# Patient Record
Sex: Male | Born: 1950 | Race: Black or African American | Hispanic: No | Marital: Single | Smoking: Never smoker
Health system: Southern US, Community
[De-identification: ages and names within clinical notes are randomized; demographics above are authoritative.]

## PROBLEM LIST (undated history)

## (undated) DIAGNOSIS — F209 Schizophrenia, unspecified: Secondary | ICD-10-CM

## (undated) DIAGNOSIS — E119 Type 2 diabetes mellitus without complications: Secondary | ICD-10-CM

## (undated) HISTORY — PX: NOSE SURGERY: SHX723

---

## 2018-01-29 ENCOUNTER — Encounter (HOSPITAL_COMMUNITY): Payer: Self-pay | Admitting: Emergency Medicine

## 2018-01-29 ENCOUNTER — Emergency Department (HOSPITAL_COMMUNITY)
Admission: EM | Admit: 2018-01-29 | Discharge: 2018-01-29 | Disposition: A | Discharge status: Home / Self Care | Payer: Medicare (Managed Care) | Attending: Emergency Medicine | Admitting: Emergency Medicine

## 2018-01-29 ENCOUNTER — Other Ambulatory Visit: Payer: Self-pay

## 2018-01-29 ENCOUNTER — Emergency Department (HOSPITAL_COMMUNITY): Payer: Medicare (Managed Care)

## 2018-01-29 DIAGNOSIS — E1165 Type 2 diabetes mellitus with hyperglycemia: Secondary | ICD-10-CM | POA: Diagnosis not present

## 2018-01-29 DIAGNOSIS — R739 Hyperglycemia, unspecified: Secondary | ICD-10-CM

## 2018-01-29 DIAGNOSIS — R5383 Other fatigue: Secondary | ICD-10-CM | POA: Insufficient documentation

## 2018-01-29 HISTORY — DX: Schizophrenia, unspecified: F20.9

## 2018-01-29 HISTORY — DX: Type 2 diabetes mellitus without complications: E11.9

## 2018-01-29 LAB — BASIC METABOLIC PANEL
ANION GAP: 12 (ref 5–15)
ANION GAP: 11 (ref 5–15)
BUN: 19 mg/dL (ref 6–20)
BUN: 15 mg/dL (ref 6–20)
CALCIUM: 8.9 mg/dL (ref 8.9–10.3)
CHLORIDE: 107 mmol/L (ref 101–111)
CO2: 24 mmol/L (ref 22–32)
CO2: 26 mmol/L (ref 22–32)
Calcium: 9 mg/dL (ref 8.9–10.3)
Chloride: 93 mmol/L — ABNORMAL LOW (ref 101–111)
Creatinine, Ser: 1.12 mg/dL (ref 0.61–1.24)
Creatinine, Ser: 0.93 mg/dL (ref 0.61–1.24)
GFR calc non Af Amer: >60 mL/min (ref >60)
GLUCOSE: 794 mg/dL — AB (ref 65–99)
Glucose, Bld: 170 mg/dL — ABNORMAL HIGH (ref 65–99)
POTASSIUM: 5.1 mmol/L (ref 3.5–5.1)
POTASSIUM: 4.6 mmol/L (ref 3.5–5.1)
SODIUM: 129 mmol/L — AB (ref 135–145)
Sodium: 144 mmol/L (ref 135–145)

## 2018-01-29 LAB — URINALYSIS, ROUTINE W REFLEX MICROSCOPIC
Bacteria, UA: NONE SEEN
Bilirubin Urine: NEGATIVE
Glucose, UA: >=500 mg/dL — AB
Hgb urine dipstick: NEGATIVE
Ketones, ur: NEGATIVE mg/dL
Nitrite: NEGATIVE
PH: 5 (ref 5.0–8.0)
PROTEIN: NEGATIVE mg/dL
Specific Gravity, Urine: 1.023 (ref 1.005–1.030)

## 2018-01-29 LAB — BLOOD GAS, VENOUS
Acid-Base Excess: 0.9 mmol/L (ref 0.0–2.0)
BICARBONATE: 26.3 mmol/L (ref 20.0–28.0)
O2 Saturation: 48.1 %
PCO2 VEN: 47.8 mmHg (ref 44.0–60.0)
PH VEN: 7.36 (ref 7.250–7.430)
Patient temperature: 98.6

## 2018-01-29 LAB — CBG MONITORING, ED
GLUCOSE-CAPILLARY: 402 mg/dL — AB (ref 65–99)
GLUCOSE-CAPILLARY: 96 mg/dL (ref 65–99)
Glucose-Capillary: >600 mg/dL (ref 65–99)

## 2018-01-29 LAB — HEPATIC FUNCTION PANEL
ALBUMIN: 3 g/dL — AB (ref 3.5–5.0)
ALT: 13 U/L — ABNORMAL LOW (ref 17–63)
AST: 16 U/L (ref 15–41)
Alkaline Phosphatase: 135 U/L — ABNORMAL HIGH (ref 38–126)
BILIRUBIN TOTAL: 0.3 mg/dL (ref 0.3–1.2)
Bilirubin, Direct: <0.1 mg/dL — ABNORMAL LOW (ref 0.1–0.5)
TOTAL PROTEIN: 7.3 g/dL (ref 6.5–8.1)

## 2018-01-29 LAB — CBC
HEMATOCRIT: 39.8 % (ref 39.0–52.0)
HEMOGLOBIN: 12.7 g/dL — AB (ref 13.0–17.0)
MCH: 28.7 pg (ref 26.0–34.0)
MCHC: 31.9 g/dL (ref 30.0–36.0)
MCV: 90 fL (ref 78.0–100.0)
Platelets: 193 10*3/uL (ref 150–400)
RBC: 4.42 MIL/uL (ref 4.22–5.81)
RDW: 15.2 % (ref 11.5–15.5)
WBC: 8.6 10*3/uL (ref 4.0–10.5)

## 2018-01-29 LAB — LIPASE, BLOOD: Lipase: 23 U/L (ref 11–51)

## 2018-01-29 MED ORDER — SODIUM CHLORIDE 0.9 % IV BOLUS
1000.0000 mL | Freq: Once | INTRAVENOUS | Status: AC
  Administered 2018-01-29: 1000 mL via INTRAVENOUS

## 2018-01-29 MED ORDER — INSULIN ASPART 100 UNIT/ML ~~LOC~~ SOLN
10.0000 [IU] | Freq: Once | SUBCUTANEOUS | Status: AC
  Administered 2018-01-29: 10 [IU] via SUBCUTANEOUS
  Filled 2018-01-29: qty 1

## 2018-01-29 MED ORDER — LACTATED RINGERS IV BOLUS
1000.0000 mL | Freq: Once | INTRAVENOUS | Status: AC
  Administered 2018-01-29: 1000 mL via INTRAVENOUS

## 2018-01-29 MED ORDER — INSULIN ASPART 100 UNIT/ML ~~LOC~~ SOLN
5.0000 [IU] | Freq: Once | SUBCUTANEOUS | Status: AC
  Administered 2018-01-29: 5 [IU] via SUBCUTANEOUS
  Filled 2018-01-29: qty 1

## 2018-01-29 MED ORDER — ONDANSETRON HCL 4 MG/2ML IJ SOLN
4.0000 mg | Freq: Once | INTRAMUSCULAR | Status: AC
  Administered 2018-01-29: 4 mg via INTRAVENOUS
  Filled 2018-01-29: qty 2

## 2018-01-29 NOTE — ED Notes (Signed)
Pt is alert and oriented x 4 and is verbally responsive. Pt dtr and granddaughter are present in the room at this time. Pt requesting food at this time.

## 2018-01-29 NOTE — ED Provider Notes (Signed)
[facility]-EMERGENCY DEPT Provider Note   CSN: [id number] Arrival date & time: 01/29/18  1315     History   Chief Complaint No chief complaint on file. hyperglycemia   HPI Vernon Martinez is a 67 y.o. male.  The history is provided by the patient and medical records.  Hyperglycemia  Blood sugar level PTA:  >600 Severity:  Severe Onset quality:  Gradual Timing:  Constant Progression:  Unchanged Chronicity:  Recurrent Diabetes status:  Controlled with insulin Context: recent change in diet (drinking ginger ale)   Relieved by:  Nothing Ineffective treatments:  None tried Associated symptoms: abdominal pain, dehydration, fatigue, malaise, nausea, polyuria and vomiting   Associated symptoms: no altered mental status, no chest pain, no dizziness, no dysuria, no fever and no shortness of breath     Past Medical History:  Diagnosis Date  . Diabetes mellitus without complication (HCC)     There are no active problems to display for this patient.   Past Surgical History:  Procedure Laterality Date  . NOSE SURGERY          Home Medications    Prior to Admission medications   Not on File    Family History No family history on file.  Social History Social History   Tobacco Use  . Smoking status: Never Smoker  . Smokeless tobacco: Never Used  Substance Use Topics  . Alcohol use: Not on file  . Drug use: Not on file     Allergies   Patient has no known allergies.   Review of Systems Review of Systems  Constitutional: Positive for chills and fatigue. Negative for fever.  HENT: Negative for congestion.   Eyes: Negative for visual disturbance.  Respiratory: Positive for cough. Negative for chest tightness, shortness of breath and wheezing.   Cardiovascular: Negative for chest pain.  Gastrointestinal: Positive for abdominal pain, nausea and vomiting. Negative for constipation and diarrhea.  Endocrine: Positive for polyuria.    Genitourinary: Negative for dysuria and flank pain.  Musculoskeletal: Negative for back pain, neck pain and neck stiffness.  Skin: Negative for rash and wound.  Neurological: Negative for dizziness, light-headedness and headaches.  Psychiatric/Behavioral: Negative for agitation.     Physical Exam Updated Vital Signs BP 124/80 (BP Location: Left Arm)   Pulse 87   Temp 98.4 F (36.9 C) (Oral)   Resp 18   Ht  (1.702 m)   Wt 74.8 kg (165 lb)   SpO2 97%   BMI 25.84 kg/m   Physical Exam  Constitutional: He is oriented to person, place, and time. He appears well-developed and well-nourished. No distress.  HENT:  Head: Normocephalic.  Nose: Nose normal.  Mouth/Throat: Oropharynx is clear and moist. No oropharyngeal exudate.  Eyes: Pupils are equal, round, and reactive to light. Conjunctivae and EOM are normal.  Neck: Normal range of motion.  Cardiovascular: Normal rate and intact distal pulses.  No murmur heard. Pulmonary/Chest: Effort normal. No respiratory distress. He has no wheezes. He has no rales. He exhibits no tenderness.  Musculoskeletal: He exhibits no edema.  Lymphadenopathy:    He has no cervical adenopathy.  Neurological: He is alert and oriented to person, place, and time. No cranial nerve deficit or sensory deficit. He exhibits normal muscle tone.  Skin: Capillary refill takes less than 2 seconds. He is not diaphoretic. No erythema. No pallor.  Psychiatric: He has a normal mood and affect.  Nursing note and vitals reviewed.    ED Treatments /  Results  Labs (all labs ordered are listed, but only abnormal results are displayed) Labs Reviewed  BASIC METABOLIC PANEL - Abnormal; Notable for the following components:      Result Value   Sodium 129 (*)    Chloride 93 (*)    Glucose, Bld 794 (*)    All other components within normal limits  CBC - Abnormal; Notable for the following components:   Hemoglobin 12.7 (*)    All other components within normal  limits  URINALYSIS, ROUTINE W REFLEX MICROSCOPIC - Abnormal; Notable for the following components:   Color, Urine STRAW (*)    Glucose, UA >=500 (*)    Leukocytes, UA TRACE (*)    All other components within normal limits  HEPATIC FUNCTION PANEL - Abnormal; Notable for the following components:   Albumin 3.0 (*)    ALT 13 (*)    Alkaline Phosphatase 135 (*)    Bilirubin, Direct <0.1 (*)    All other components within normal limits  CBG MONITORING, ED - Abnormal; Notable for the following components:   Glucose-Capillary >600 (*)    All other components within normal limits  URINE CULTURE  LIPASE, BLOOD  BLOOD GAS, VENOUS  BASIC METABOLIC PANEL    EKG None  Radiology Dg Chest 2 View  Result Date: 01/29/2018 CLINICAL DATA:  Hyperglycemia and cough EXAM: CHEST - 2 VIEW COMPARISON:  None. FINDINGS: The heart size and mediastinal contours are within normal limits. Both lungs are clear. The visualized skeletal structures are unremarkable. IMPRESSION: No active cardiopulmonary disease. Electronically Signed   By: Alcide Clever M.D.   On: 01/29/2018 15:11    Procedures Procedures (including critical care time)  CRITICAL CARE Performed by: Canary Brim Tegeler Total critical care time: 35 minutes Critical care time was exclusive of separately billable procedures and treating other patients. Glucose >600 with iv insulin and fluids administered.  Critical care was necessary to treat or prevent imminent or life-threatening deterioration. Critical care was time spent personally by me on the following activities: development of treatment plan with patient and/or surrogate as well as nursing, discussions with consultants, evaluation of patient's response to treatment, examination of patient, obtaining history from patient or surrogate, ordering and performing treatments and interventions, ordering and review of laboratory studies, ordering and review of radiographic studies, pulse oximetry and  re-evaluation of patient's condition.   Medications Ordered in ED Medications  sodium chloride 0.9 % bolus 1,000 mL (1,000 mLs Intravenous New Bag/Given 01/29/18 1436)  ondansetron (ZOFRAN) injection 4 mg (4 mg Intravenous Given 01/29/18 1437)  insulin aspart (novoLOG) injection 10 Units (10 Units Subcutaneous Given 01/29/18 1531)  sodium chloride 0.9 % bolus 1,000 mL (1,000 mLs Intravenous New Bag/Given 01/29/18 1530)     Initial Impression / Assessment and Plan / ED Course  I have reviewed the triage vital signs and the nursing notes.  Pertinent labs & imaging results that were available during my care of the patient were reviewed by me and considered in my medical decision making (see chart for details).     Vernon Martinez is a 67 y.o. male with a past medical history significant for diabetes and schizophrenia who presents for nausea, vomiting, cough, hemoptysis, fatigue, urinary frequency, and hyperglycemia.  Patient is coming by family who reports that patient has been down in [place] for the last week and has been drinking lots of normal sugar ginger ale.  He reports that he gets very agitated and angry when he does not  get his ginger ale.  They are concerned that this may have sent him into elevated sugars.  Patient says that he has had a mild cough that has had some phlegm production as well as very faint hemoptysis.  He denies any chest pain but reports some left-sided abdominal pain.  He reports it is mild to moderate.  He does report some urinary frequency but denies dysuria.  He denies any abdominal trauma.  He reports the pain is cramping with nausea and vomiting.  He denies significant back pain.  He reports some mild fatigue but reports feeling dehydrated.   On exam, patient is alert.  Patient's lungs were clear.  Chest was nontender.  Abdomen was not tender on my exam.  Legs are nonedematous.  Patient's mucous membranes appear dry.  Clinically I am concerned for DKA  has  the patient's initial point-of-care glucose was greater than 600.  We will also look for infection may have contributed to his hyperglycemia given the cough, hemoptysis, and urinary symptoms.  Patient be given initial fluids and nausea medicine.  Patient will work-up to look for DKA.  Anticipate reassessment after work-up.  Patient's laboratory testing and work-up began to return.  No evidence of DKA as he is not acidotic.  Anion gap is normal.  Patient was found to have hyperglycemia of 794.  No evidence of UTI or pneumonia.  No leukocytosis.  Mild anemia.  Next  Patient will be given a second liter normal saline and given 10 units of insulin.  Next  Patient will have his glucose reassessed after interventions.  Given patient's lack of DKA, if glucose is improving, anticipate discharge home to continue his outpatient glucose management regimen and will be educated to avoid the sugary soft drinks which he reports he is drinking constantly.  Care transferred to Dr. Clarene Duke while awaiting repeat metabolic panel after fluids and insulin.   Final Clinical Impressions(s) / ED Diagnoses   Final diagnoses:  Hyperglycemia    Clinical Impression: 1. Hyperglycemia     Disposition: Care transferred to Dr. Clarene Duke while awaiting reassessment after fluids and insulin.  This note was prepared with assistance of Conservation officer, historic buildings. Occasional wrong-word or sound-a-like substitutions may have occurred due to the inherent limitations of voice recognition software.      Tegeler, Canary Brim, MD 01/29/18 2002

## 2018-01-29 NOTE — ED Notes (Signed)
P 

## 2018-01-29 NOTE — ED Notes (Signed)
When taking pt back to treatment room, grand daught was telling me how patient gets aggressive and upset with family when they try to tell him he can't drink sodas or eat lots of sweets.  Pt very addiment about not staying the night in hospital.  Pt refusing to get into gown when got to room

## 2018-01-29 NOTE — ED Notes (Signed)
Date and time results received: 01/29/18 1504 (use smartphrase ".now" to insert current time)  Test: Glucose Critical Value: 794  Name of Provider Notified: Dr. Rush Landmark  Orders Received? Or Actions Taken?:

## 2018-01-29 NOTE — ED Provider Notes (Signed)
I received this patient in signout from Dr. Rush Landmark.  He had presented with hyperglycemia in the setting of noncompliance with diabetic diet.  He was receiving insulin, fluids and we were awaiting improvement in blood glucose.  Repeat blood glucose after 2 L of fluids is 400.  Gave third liter of fluids as well as repeat dose of NovoLog.  Repeat BMP improved w/ glucose 170. Instructed pt to eat dinner with high protein, low carbs.  I emphasized the importance of strict compliance with diabetic diet and continuing all medications especially his insulin at home.  I have extensively reviewed return precautions with him and he has voiced understanding.  Patient discharged in satisfactory condition.   Little, Ambrose [place], MD 01/29/18 2024

## 2018-01-29 NOTE — ED Triage Notes (Signed)
Pt from [place] here visiting family, reports his CBG 500 and been urinating a lot. Called PCP instructed pt to go to nearest ED.

## 2018-01-30 LAB — URINE CULTURE

## 2019-05-10 IMAGING — CR DG CHEST 2V
2 series · 2 of 2 positions shown · non-contrast
Comparison: None.

CLINICAL DATA: Hyperglycemia and cough

EXAM:
CHEST - 2 VIEW

[w chest lat]
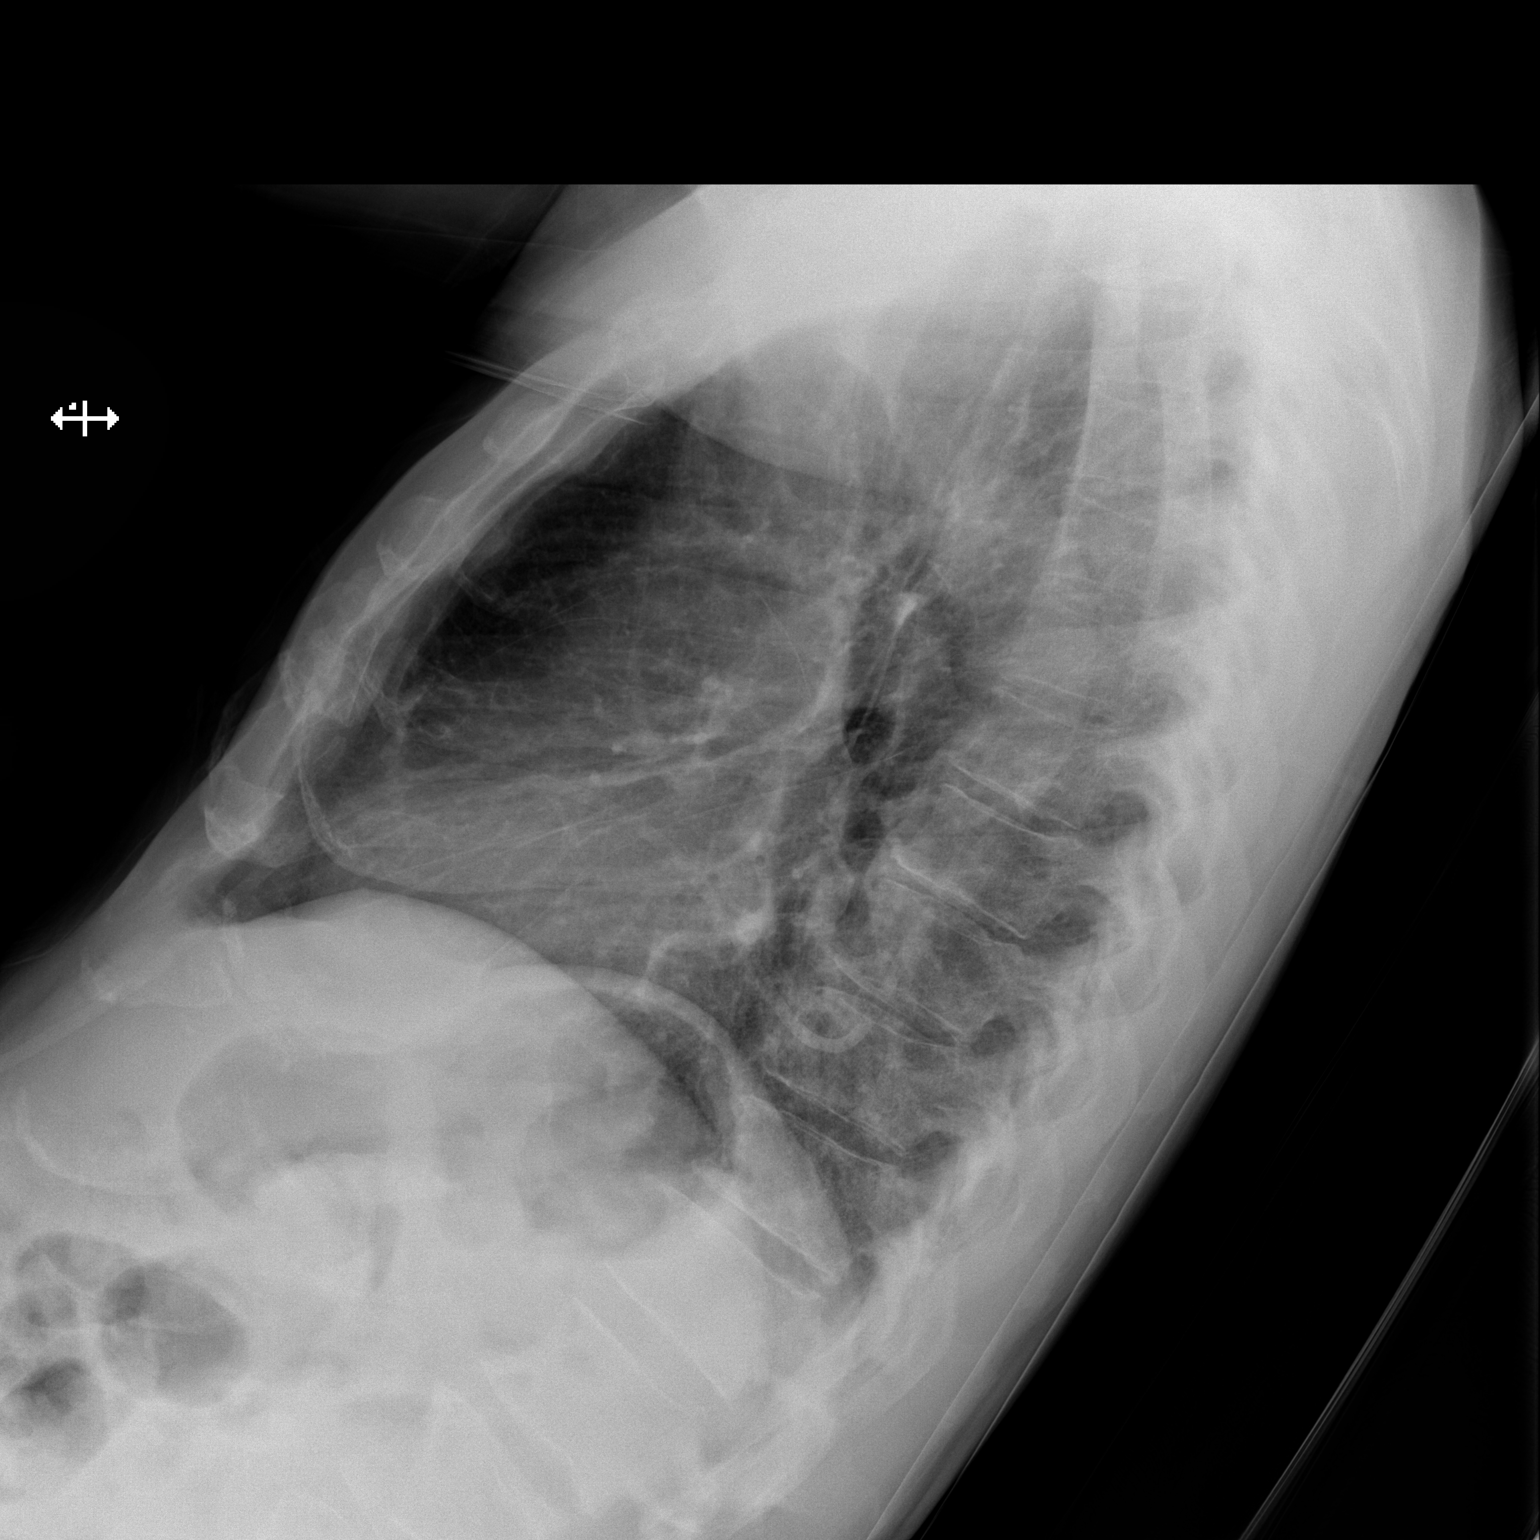

[x chest ap]
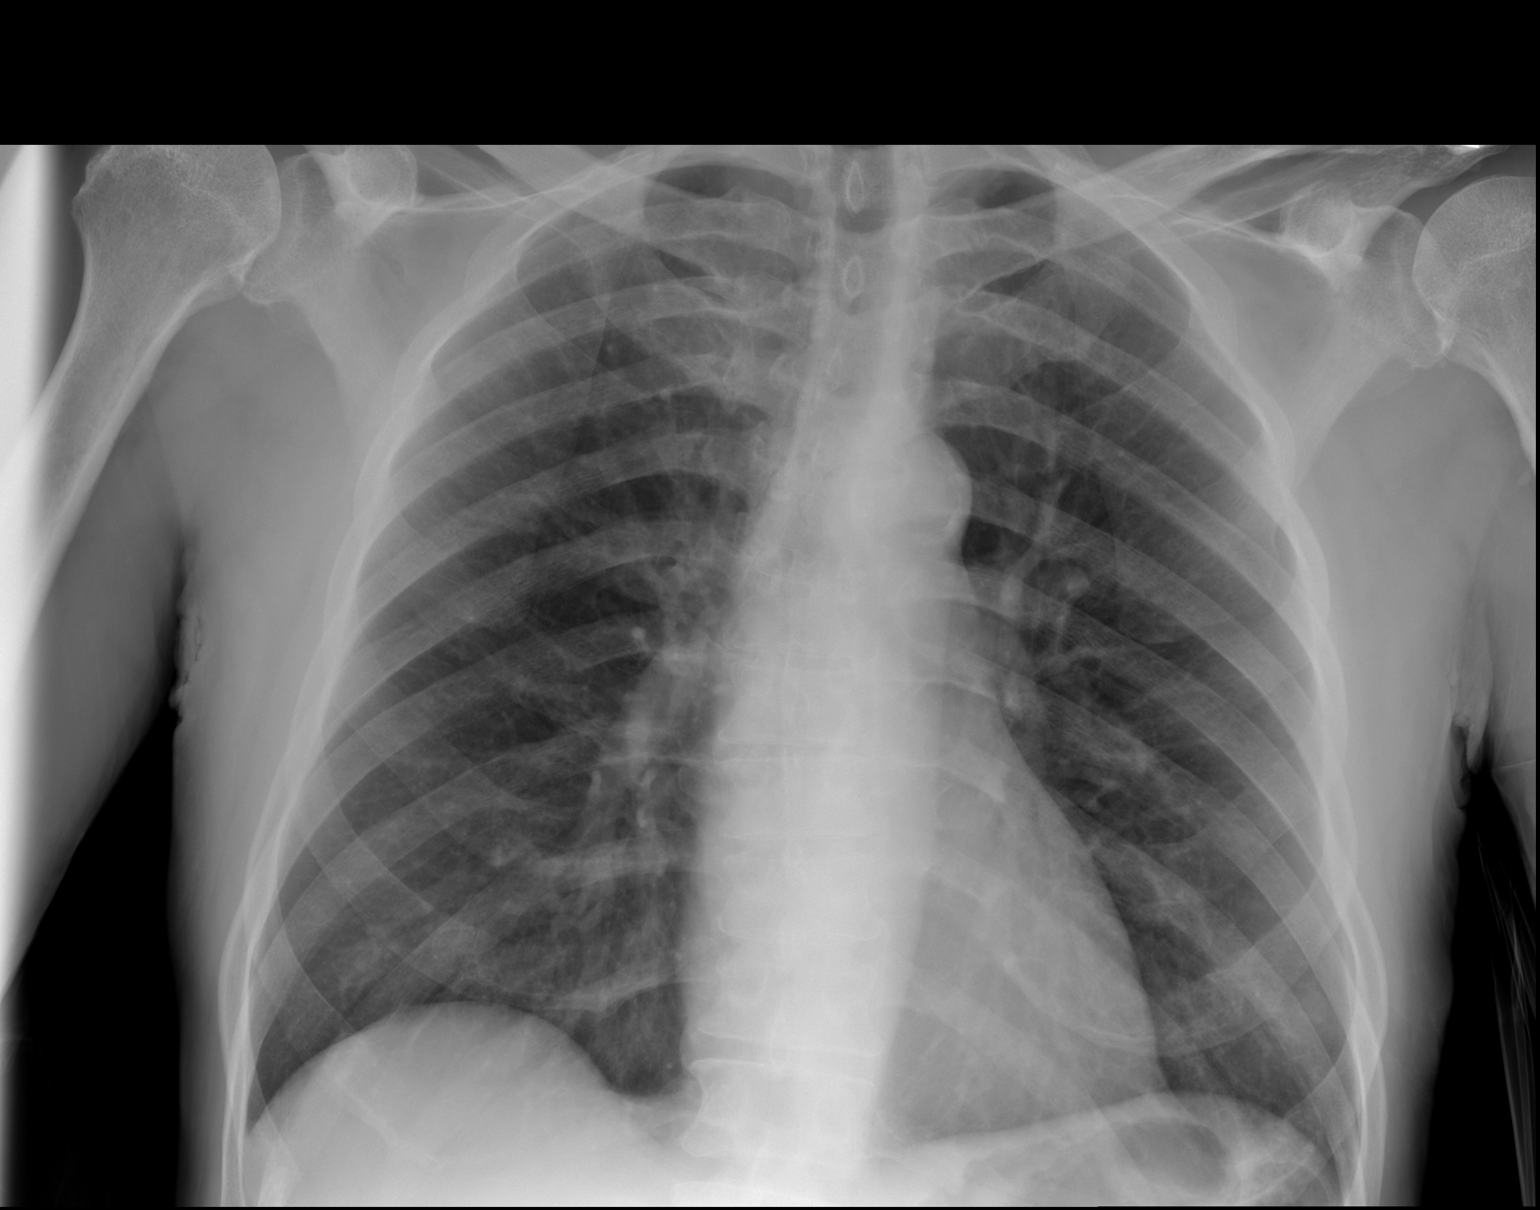

[2 of 2 positions shown; findings below may reference images not displayed]

FINDINGS: The heart size and mediastinal contours are within normal limits.
Both lungs are clear. The visualized skeletal structures are
unremarkable.
IMPRESSION: No active cardiopulmonary disease.

## 2020-08-17 DEATH — deceased
